# Patient Record
Sex: Female | Born: 1995 | Race: White | Hispanic: No | Marital: Single | Smoking: Current every day smoker
Health system: Southern US, Community
[De-identification: ages and names within clinical notes are randomized; demographics above are authoritative.]

## PROBLEM LIST (undated history)

## (undated) DIAGNOSIS — F32A Depression, unspecified: Secondary | ICD-10-CM

## (undated) DIAGNOSIS — F329 Major depressive disorder, single episode, unspecified: Secondary | ICD-10-CM

## (undated) DIAGNOSIS — F419 Anxiety disorder, unspecified: Secondary | ICD-10-CM

---
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
## 2018-08-25 ENCOUNTER — Encounter (HOSPITAL_BASED_OUTPATIENT_CLINIC_OR_DEPARTMENT_OTHER): Payer: Self-pay

## 2018-08-25 ENCOUNTER — Emergency Department (HOSPITAL_BASED_OUTPATIENT_CLINIC_OR_DEPARTMENT_OTHER): Payer: No Typology Code available for payment source

## 2018-08-25 ENCOUNTER — Other Ambulatory Visit: Payer: Self-pay

## 2018-08-25 ENCOUNTER — Emergency Department (HOSPITAL_BASED_OUTPATIENT_CLINIC_OR_DEPARTMENT_OTHER)
Admission: EM | Admit: 2018-08-25 | Discharge: 2018-08-25 | Disposition: A | Discharge status: Home / Self Care | Payer: No Typology Code available for payment source | Attending: Emergency Medicine | Admitting: Emergency Medicine

## 2018-08-25 DIAGNOSIS — F17210 Nicotine dependence, cigarettes, uncomplicated: Secondary | ICD-10-CM | POA: Diagnosis not present

## 2018-08-25 DIAGNOSIS — Y999 Unspecified external cause status: Secondary | ICD-10-CM | POA: Insufficient documentation

## 2018-08-25 DIAGNOSIS — S99911A Unspecified injury of right ankle, initial encounter: Secondary | ICD-10-CM | POA: Insufficient documentation

## 2018-08-25 DIAGNOSIS — Y92410 Unspecified street and highway as the place of occurrence of the external cause: Secondary | ICD-10-CM | POA: Insufficient documentation

## 2018-08-25 DIAGNOSIS — Z79899 Other long term (current) drug therapy: Secondary | ICD-10-CM | POA: Diagnosis not present

## 2018-08-25 DIAGNOSIS — Y9389 Activity, other specified: Secondary | ICD-10-CM | POA: Diagnosis not present

## 2018-08-25 DIAGNOSIS — S0083XA Contusion of other part of head, initial encounter: Secondary | ICD-10-CM | POA: Diagnosis not present

## 2018-08-25 MED ORDER — NAPROXEN 500 MG PO TABS: 500.0000 mg | ORAL_TABLET | Freq: Two times a day (BID) | ORAL | 0 refills | Status: AC

## 2018-08-25 MED ORDER — IBUPROFEN 800 MG PO TABS
800.0000 mg | ORAL_TABLET | Freq: Once | ORAL | Status: AC
  Administered 2018-08-25: 800 mg via ORAL
  Filled 2018-08-25: qty 1
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
